# Patient Record
Sex: Female | Born: 1950 | Race: Black or African American | Hispanic: No | Smoking: Current every day smoker
Health system: Southern US, Community
[De-identification: ages and names within clinical notes are randomized; demographics above are authoritative.]

## PROBLEM LIST (undated history)

## (undated) DIAGNOSIS — I10 Essential (primary) hypertension: Secondary | ICD-10-CM

## (undated) HISTORY — PX: TOTAL HIP ARTHROPLASTY: SHX124

---

## 2011-06-30 ENCOUNTER — Other Ambulatory Visit (HOSPITAL_COMMUNITY)
Admission: RE | Admit: 2011-06-30 | Discharge: 2011-06-30 | Disposition: A | Discharge status: Home / Self Care | Payer: BC Managed Care – PPO | Source: Ambulatory Visit | Attending: Obstetrics and Gynecology | Admitting: Obstetrics and Gynecology

## 2011-06-30 DIAGNOSIS — Z124 Encounter for screening for malignant neoplasm of cervix: Secondary | ICD-10-CM | POA: Insufficient documentation

## 2011-07-30 ENCOUNTER — Encounter (HOSPITAL_COMMUNITY): Payer: Self-pay | Admitting: Pharmacist

## 2011-08-03 ENCOUNTER — Inpatient Hospital Stay (HOSPITAL_COMMUNITY): Admission: RE | Admit: 2011-08-03 | Payer: Self-pay | Source: Ambulatory Visit

## 2011-08-07 ENCOUNTER — Encounter (HOSPITAL_COMMUNITY): Admission: RE | Payer: Self-pay | Source: Ambulatory Visit

## 2011-08-07 ENCOUNTER — Inpatient Hospital Stay (HOSPITAL_COMMUNITY)
Admission: RE | Admit: 2011-08-07 | Payer: BC Managed Care – PPO | Source: Ambulatory Visit | Admitting: Obstetrics and Gynecology

## 2011-08-07 SURGERY — HYSTERECTOMY, ABDOMINAL
Anesthesia: General

## 2015-05-17 ENCOUNTER — Other Ambulatory Visit: Payer: Self-pay | Admitting: Internal Medicine

## 2015-05-17 DIAGNOSIS — Z780 Asymptomatic menopausal state: Secondary | ICD-10-CM

## 2015-05-17 DIAGNOSIS — Z1231 Encounter for screening mammogram for malignant neoplasm of breast: Secondary | ICD-10-CM

## 2015-11-30 DIAGNOSIS — M1990 Unspecified osteoarthritis, unspecified site: Secondary | ICD-10-CM | POA: Diagnosis not present

## 2015-11-30 DIAGNOSIS — I10 Essential (primary) hypertension: Secondary | ICD-10-CM | POA: Diagnosis not present

## 2015-11-30 DIAGNOSIS — Z720 Tobacco use: Secondary | ICD-10-CM | POA: Diagnosis not present

## 2015-11-30 DIAGNOSIS — K219 Gastro-esophageal reflux disease without esophagitis: Secondary | ICD-10-CM | POA: Diagnosis not present

## 2016-08-02 DIAGNOSIS — I10 Essential (primary) hypertension: Secondary | ICD-10-CM | POA: Diagnosis not present

## 2016-08-02 DIAGNOSIS — Z131 Encounter for screening for diabetes mellitus: Secondary | ICD-10-CM | POA: Diagnosis not present

## 2016-08-02 DIAGNOSIS — Z01118 Encounter for examination of ears and hearing with other abnormal findings: Secondary | ICD-10-CM | POA: Diagnosis not present

## 2016-08-02 DIAGNOSIS — Z0000 Encounter for general adult medical examination without abnormal findings: Secondary | ICD-10-CM | POA: Diagnosis not present

## 2016-08-02 DIAGNOSIS — Z136 Encounter for screening for cardiovascular disorders: Secondary | ICD-10-CM | POA: Diagnosis not present

## 2016-08-04 DIAGNOSIS — Z7689 Persons encountering health services in other specified circumstances: Secondary | ICD-10-CM | POA: Diagnosis not present

## 2016-08-04 DIAGNOSIS — R9431 Abnormal electrocardiogram [ECG] [EKG]: Secondary | ICD-10-CM | POA: Diagnosis not present

## 2016-08-04 DIAGNOSIS — I10 Essential (primary) hypertension: Secondary | ICD-10-CM | POA: Diagnosis not present

## 2017-03-09 DIAGNOSIS — M25551 Pain in right hip: Secondary | ICD-10-CM | POA: Diagnosis not present

## 2017-03-09 DIAGNOSIS — Z96643 Presence of artificial hip joint, bilateral: Secondary | ICD-10-CM | POA: Diagnosis not present

## 2017-03-09 DIAGNOSIS — Z96641 Presence of right artificial hip joint: Secondary | ICD-10-CM | POA: Diagnosis not present

## 2017-03-09 DIAGNOSIS — Z471 Aftercare following joint replacement surgery: Secondary | ICD-10-CM | POA: Diagnosis not present

## 2017-03-09 DIAGNOSIS — S7001XA Contusion of right hip, initial encounter: Secondary | ICD-10-CM | POA: Diagnosis not present

## 2017-03-29 DIAGNOSIS — S7001XA Contusion of right hip, initial encounter: Secondary | ICD-10-CM | POA: Diagnosis not present

## 2017-04-16 ENCOUNTER — Emergency Department (HOSPITAL_BASED_OUTPATIENT_CLINIC_OR_DEPARTMENT_OTHER): Payer: Medicare Other

## 2017-04-16 ENCOUNTER — Emergency Department (HOSPITAL_BASED_OUTPATIENT_CLINIC_OR_DEPARTMENT_OTHER)
Admission: EM | Admit: 2017-04-16 | Discharge: 2017-04-17 | Disposition: A | Discharge status: Home / Self Care | Payer: Medicare Other | Attending: Emergency Medicine | Admitting: Emergency Medicine

## 2017-04-16 ENCOUNTER — Encounter (HOSPITAL_BASED_OUTPATIENT_CLINIC_OR_DEPARTMENT_OTHER): Payer: Self-pay | Admitting: *Deleted

## 2017-04-16 DIAGNOSIS — I10 Essential (primary) hypertension: Secondary | ICD-10-CM | POA: Insufficient documentation

## 2017-04-16 DIAGNOSIS — Y999 Unspecified external cause status: Secondary | ICD-10-CM | POA: Insufficient documentation

## 2017-04-16 DIAGNOSIS — Z79899 Other long term (current) drug therapy: Secondary | ICD-10-CM | POA: Insufficient documentation

## 2017-04-16 DIAGNOSIS — W01198A Fall on same level from slipping, tripping and stumbling with subsequent striking against other object, initial encounter: Secondary | ICD-10-CM | POA: Insufficient documentation

## 2017-04-16 DIAGNOSIS — Y93E5 Activity, floor mopping and cleaning: Secondary | ICD-10-CM | POA: Diagnosis not present

## 2017-04-16 DIAGNOSIS — Z96643 Presence of artificial hip joint, bilateral: Secondary | ICD-10-CM | POA: Diagnosis not present

## 2017-04-16 DIAGNOSIS — F17200 Nicotine dependence, unspecified, uncomplicated: Secondary | ICD-10-CM | POA: Diagnosis not present

## 2017-04-16 DIAGNOSIS — S79911A Unspecified injury of right hip, initial encounter: Secondary | ICD-10-CM | POA: Insufficient documentation

## 2017-04-16 DIAGNOSIS — Y929 Unspecified place or not applicable: Secondary | ICD-10-CM | POA: Insufficient documentation

## 2017-04-16 HISTORY — DX: Essential (primary) hypertension: I10

## 2017-04-16 NOTE — ED Provider Notes (Signed)
[facility] Provider Note   CSN: [id number] Arrival date & time: 04/16/17  1949     History   Chief Complaint Chief Complaint  Patient presents with  . Hip Pain    HPI Jenna Wheeler is a 66 y.o. female.  HPI   Jenna Wheeler is a 66 y.o. female, with a history of HTN and bilateral hip arthroplasty, presenting to the ED with Right hip injury that occurred shortly prior to arrival. Patient states she slipped and fell today while mopping the floor, landing on her right hip. Pain is throbbing 2/10 at rest, 8/10 ambulation. Patient adds she fell 6 weeks ago and sustained a hematoma in the same area. She has an appointment on September 6 with her orthopedic surgeon, Dr. Christell Constant, to drain the hematoma. Denies head injury, LOC, neuro deficit, neck/back pain, pelvic pain, or any other complaints.     Past Medical History:  Diagnosis Date  . Hypertension     There are no active problems to display for this patient.   Past Surgical History:  Procedure Laterality Date  . TOTAL HIP ARTHROPLASTY      OB History    No data available       Home Medications    Prior to Admission medications   Medication Sig Start Date End Date Taking? Authorizing Provider  AMLODIPINE BESYLATE PO Take by mouth.   Yes [provider]  HYDROCHLOROTHIAZIDE PO Take by mouth.   Yes [provider]  LOSARTAN POTASSIUM PO Take by mouth.   Yes [provider]  HYDROcodone-acetaminophen (NORCO/VICODIN) 5-325 MG tablet Take 1 tablet by mouth every 6 (six) hours as needed for severe pain. 04/17/17   Anselm Pancoast, PA-C    Family History No family history on file.  Social History Social History  Substance Use Topics  . Smoking status: Current Every Day Smoker  . Smokeless tobacco: Never Used  . Alcohol use No     Allergies   Patient has no known allergies.   Review of Systems Review of Systems  Respiratory: Negative for shortness of breath.     Cardiovascular: Negative for chest pain.  Gastrointestinal: Negative for abdominal pain, nausea and vomiting.  Musculoskeletal: Positive for arthralgias and joint swelling. Negative for back pain and neck pain.  Skin: Negative for wound.  Neurological: Negative for dizziness, syncope, weakness, light-headedness, numbness and headaches.  All other systems reviewed and are negative.    Physical Exam Updated Vital Signs BP (!) 163/92 (BP Location: Right Arm)   Pulse 63   Temp 98.3 F (36.8 C) (Oral)   Resp 18   Ht 5' 7.5" (1.715 m)   Wt 79.4 kg (175 lb)   SpO2 97%   BMI 27.00 kg/m   Physical Exam  Constitutional: She appears well-developed and well-nourished. No distress.  HENT:  Head: Normocephalic and atraumatic.  Eyes: Conjunctivae are normal.  Neck: Neck supple.  Cardiovascular: Normal rate, regular rhythm, normal heart sounds and intact distal pulses.   Pulmonary/Chest: Effort normal and breath sounds normal. No respiratory distress.  Abdominal: Soft. There is no tenderness. There is no guarding.  Musculoskeletal: She exhibits tenderness. She exhibits no edema or deformity.  Patient has an older appearing, firm hematoma to the right lateral hip. She has new or area of swelling, minor tenderness, and fluctuance just distal to this. Total area of swelling 10-15 cm in diameter. Full range of motion in the bilateral hips, knees, and ankles. Patient is weightbearing without assistance.  Lymphadenopathy:    She has no cervical adenopathy.  Neurological: She is alert.  No noted sensory deficits in the bilateral lower extremities. Strength is 5/5 with flexion and extension at the bilateral hips, knees, and ankles. Ambulatory without assistance or gait deficit.  Skin: Skin is warm and dry. Capillary refill takes less than 2 seconds. She is not diaphoretic.  Psychiatric: She has a normal mood and affect. Her behavior is normal.  Nursing note and vitals reviewed.    ED Treatments  / Results  Labs (all labs ordered are listed, but only abnormal results are displayed) Labs Reviewed - No data to display  EKG  EKG Interpretation None       Radiology Dg Hip Unilat W Or Wo Pelvis 1 View Right  Result Date: 04/16/2017 CLINICAL DATA:  Larey Seat a month ago, had hematoma RIGHT hip, fell again today on same hip EXAM: DG HIP (WITH OR WITHOUT PELVIS) 1V RIGHT COMPARISON:  None FINDINGS: BILATERAL hip replacements. Bones demineralized. SI joints symmetric and preserved. No acute fracture, dislocation, or bone destruction. No periprosthetic lucency at RIGHT hip prosthesis ; entirety of the femoral component of LEFT hip prosthesis is not imaged. Scattered phleboliths and vascular calcifications. IMPRESSION: Post BILATERAL total hip arthroplasty. No acute bony abnormalities. Electronically Signed   By: Ulyses Southward M.D.   On: 04/16/2017 20:56    Procedures Procedures (including critical care time)  Medications Ordered in ED Medications - No data to display   Initial Impression / Assessment and Plan / ED Course  I have reviewed the triage vital signs and the nursing notes.  Pertinent labs & imaging results that were available during my care of the patient were reviewed by me and considered in my medical decision making (see chart for details).  Clinical Course as of Apr 17 214  Tue Apr 17, 2017  0028 CT tech informed me that performing a CT on this patient would likely create too much artifact from her bilateral hip arthroplasties to be much value.  [SJ]    Clinical Course User Index [SJ] Matina Rodier C, PA-C    Patient presents with right hip injury. She is ambulatory and has full range of motion in the hip. She has close follow-up with orthopedic surgeon. The patient was given instructions for home care as well as return precautions. Patient voices understanding of these instructions, accepts the plan, and is comfortable with discharge.  Findings and plan of care discussed  with Paula Libra, MD. Dr. Read Drivers personally evaluated and examined this patient.  Final Clinical Impressions(s) / ED Diagnoses   Final diagnoses:  Hip injury, right, initial encounter    New Prescriptions Discharge Medication List as of 04/17/2017 12:36 AM    START taking these medications   Details  HYDROcodone-acetaminophen (NORCO/VICODIN) 5-325 MG tablet Take 1 tablet by mouth every 6 (six) hours as needed for severe pain., Starting Tue 04/17/2017, Print         Ahtziry Saathoff C, PA-C 04/17/17 0216    Molpus, Jonny Ruiz, MD 04/17/17 [phone]

## 2017-04-16 NOTE — ED Notes (Signed)
Patient fell 6 weeks ago and sustained a hematoma about a size of a golf ball and went to see her Orthopedist.  Her Orthopedist informed her that the swelling will subside in few weeks or months and if it does not subside, he will test it or drain it per pt info.  She fell again this afternoon around 3 pm in the kitchen and she noticed that the swelling is more bigger than before.  She stated that the first time she fell, it did not even hurt her.  But this incident hurts her hip but she is still able to walk but slightly limping per patient.

## 2017-04-16 NOTE — ED Triage Notes (Addendum)
She fell a month ago while chasing her grandson and sustained a hematoma to her right hip. She has an appointment to have it drained. She fell a second time today when she lost her balance while mopping the floor. Painful. She is ambulatory with a limp.

## 2017-04-17 ENCOUNTER — Emergency Department (HOSPITAL_BASED_OUTPATIENT_CLINIC_OR_DEPARTMENT_OTHER): Payer: Medicare Other

## 2017-04-17 MED ORDER — HYDROCODONE-ACETAMINOPHEN 5-325 MG PO TABS: 1.0000 | ORAL_TABLET | Freq: Four times a day (QID) | ORAL | 0 refills | Status: DC | PRN

## 2017-04-17 NOTE — Discharge Instructions (Signed)
There were no abnormalities noted on the x-ray. Please follow-up with the orthopedic surgeon, as planned. May take ibuprofen, naproxen, or Tylenol for pain. Vicodin for severe pain. Do not drive or perform other dangerous activities while taking the Vicodin.

## 2017-05-10 DIAGNOSIS — S7001XA Contusion of right hip, initial encounter: Secondary | ICD-10-CM | POA: Diagnosis not present

## 2017-05-10 DIAGNOSIS — T792XXA Traumatic secondary and recurrent hemorrhage and seroma, initial encounter: Secondary | ICD-10-CM | POA: Diagnosis not present

## 2017-05-25 DIAGNOSIS — Z01818 Encounter for other preprocedural examination: Secondary | ICD-10-CM | POA: Diagnosis not present

## 2017-05-25 DIAGNOSIS — Z01810 Encounter for preprocedural cardiovascular examination: Secondary | ICD-10-CM | POA: Diagnosis not present

## 2017-05-28 DIAGNOSIS — Z96643 Presence of artificial hip joint, bilateral: Secondary | ICD-10-CM | POA: Diagnosis not present

## 2017-05-28 DIAGNOSIS — E049 Nontoxic goiter, unspecified: Secondary | ICD-10-CM | POA: Diagnosis not present

## 2017-05-28 DIAGNOSIS — S7011XA Contusion of right thigh, initial encounter: Secondary | ICD-10-CM | POA: Diagnosis not present

## 2017-05-28 DIAGNOSIS — T792XXA Traumatic secondary and recurrent hemorrhage and seroma, initial encounter: Secondary | ICD-10-CM | POA: Diagnosis not present

## 2017-05-28 DIAGNOSIS — M1990 Unspecified osteoarthritis, unspecified site: Secondary | ICD-10-CM | POA: Diagnosis not present

## 2017-05-28 DIAGNOSIS — I10 Essential (primary) hypertension: Secondary | ICD-10-CM | POA: Diagnosis not present

## 2017-05-28 DIAGNOSIS — K219 Gastro-esophageal reflux disease without esophagitis: Secondary | ICD-10-CM | POA: Diagnosis not present

## 2018-06-16 IMAGING — DX DG HIP (WITH OR WITHOUT PELVIS) 1V*R*
3 series · 3 of 3 positions shown · non-contrast
Comparison: None

CLINICAL DATA: Fell a month ago, had hematoma RIGHT hip, fell again
today on same hip

EXAM:
DG HIP (WITH OR WITHOUT PELVIS) 1V RIGHT

[pelvis ap]
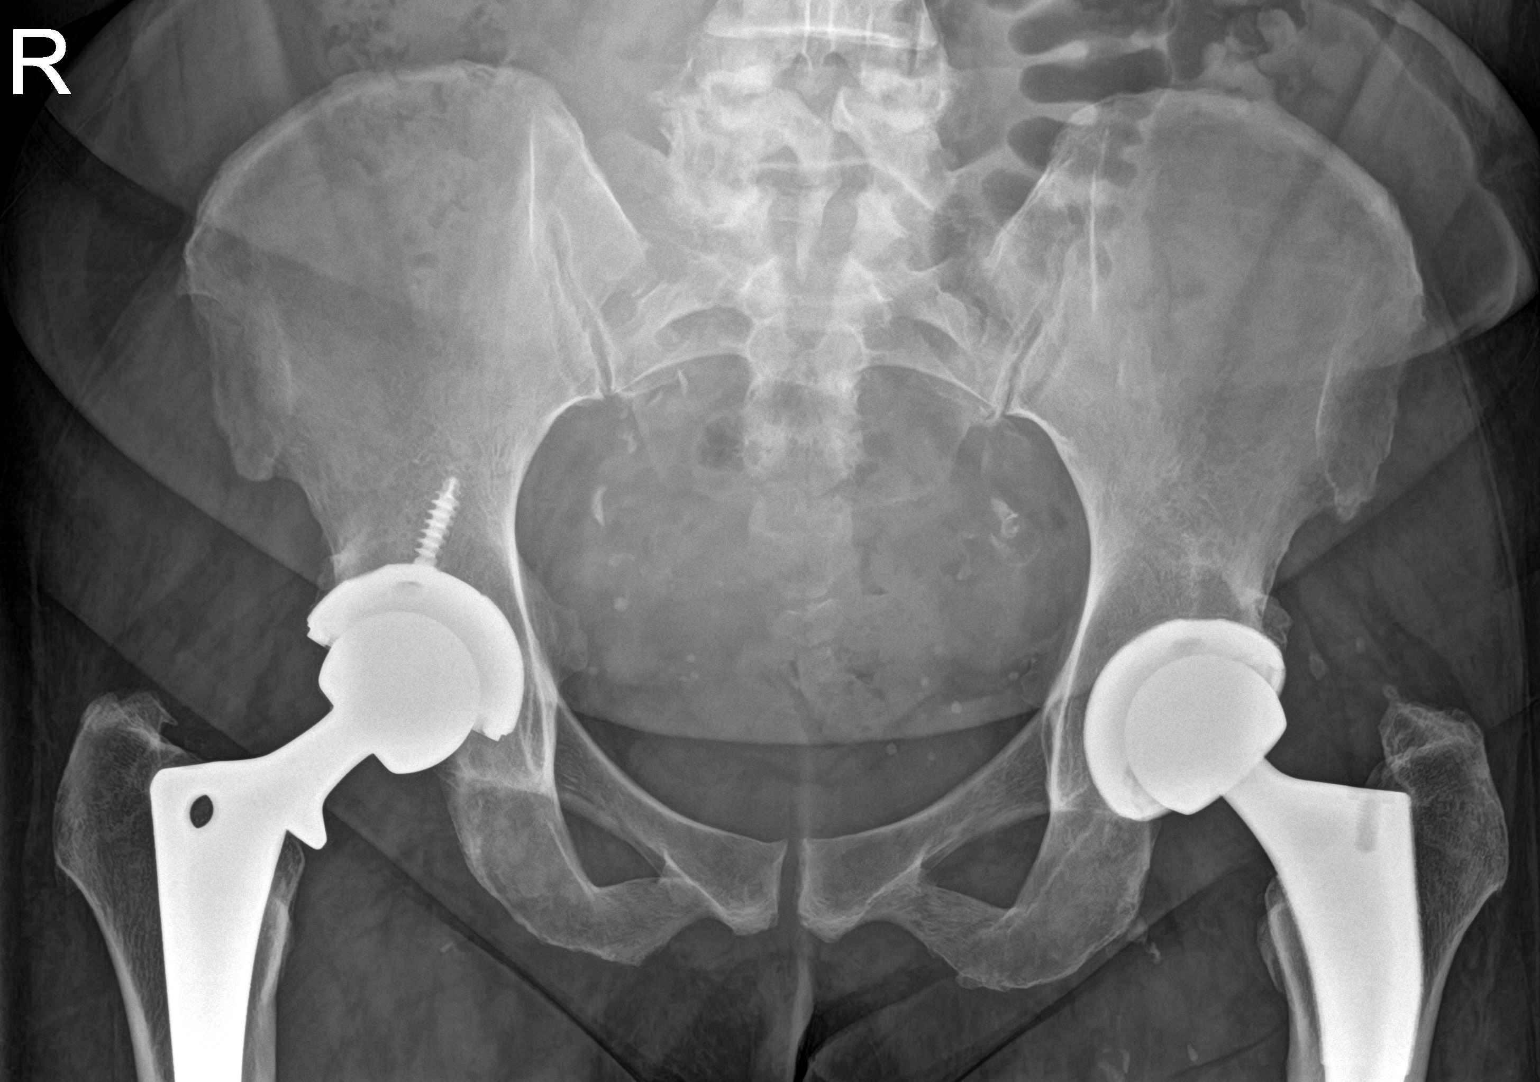

[hip ap]
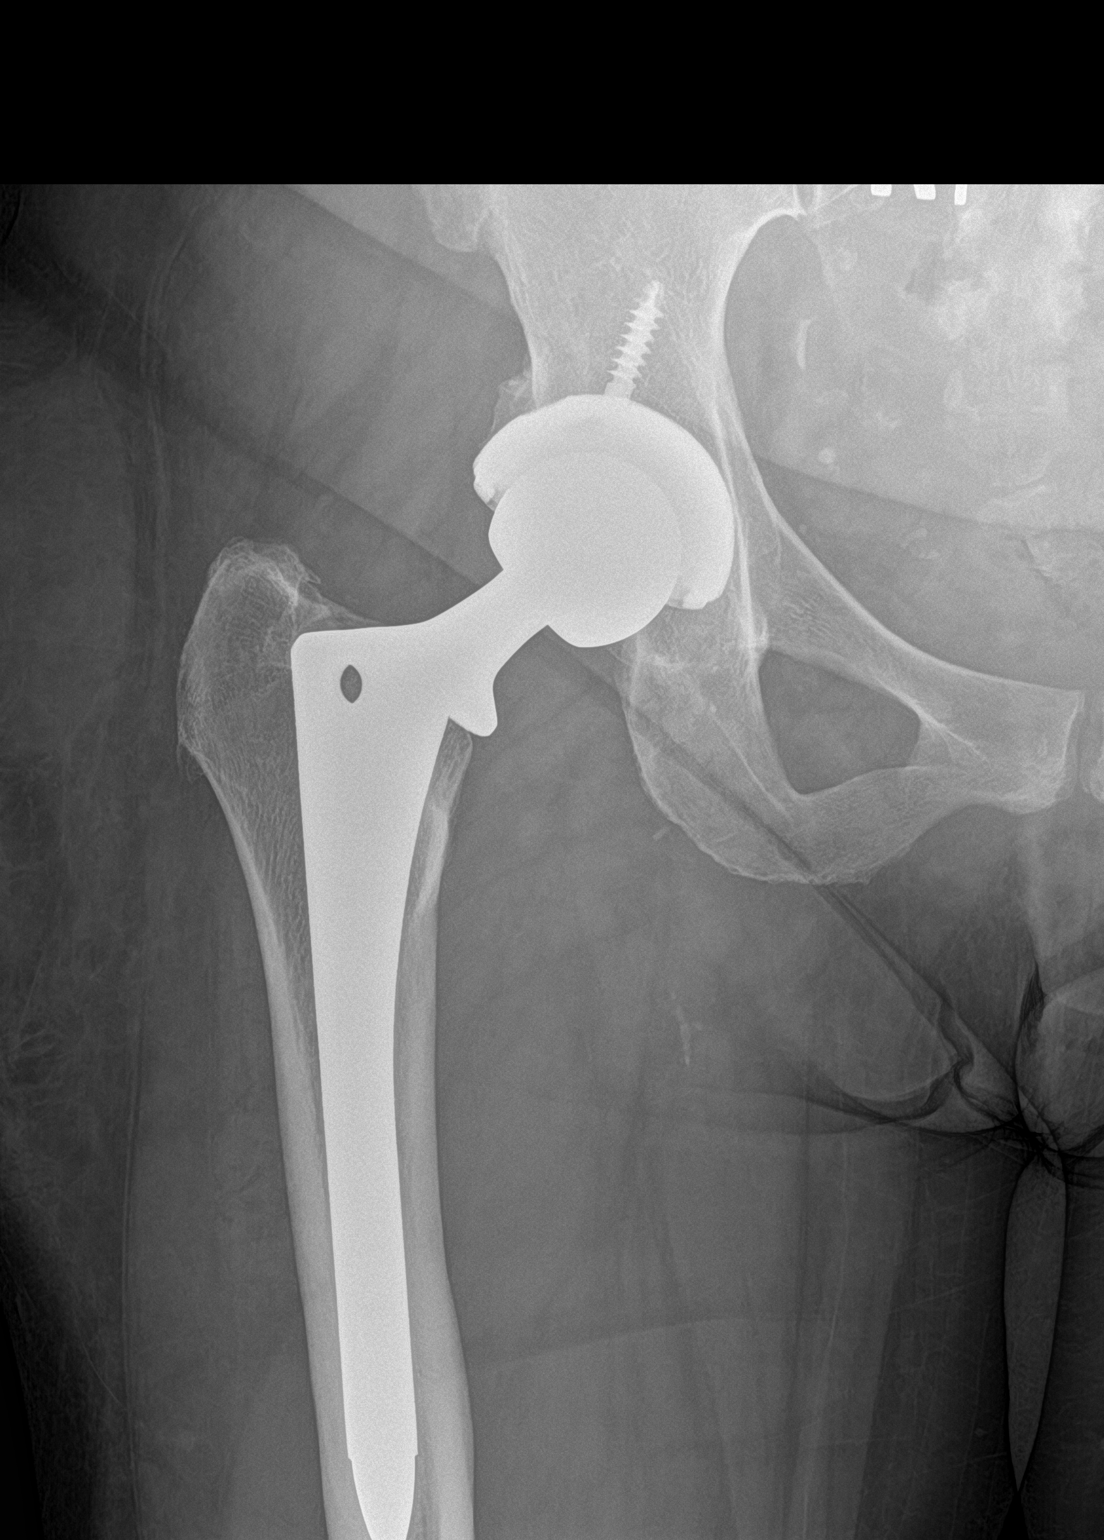

[hip lat]
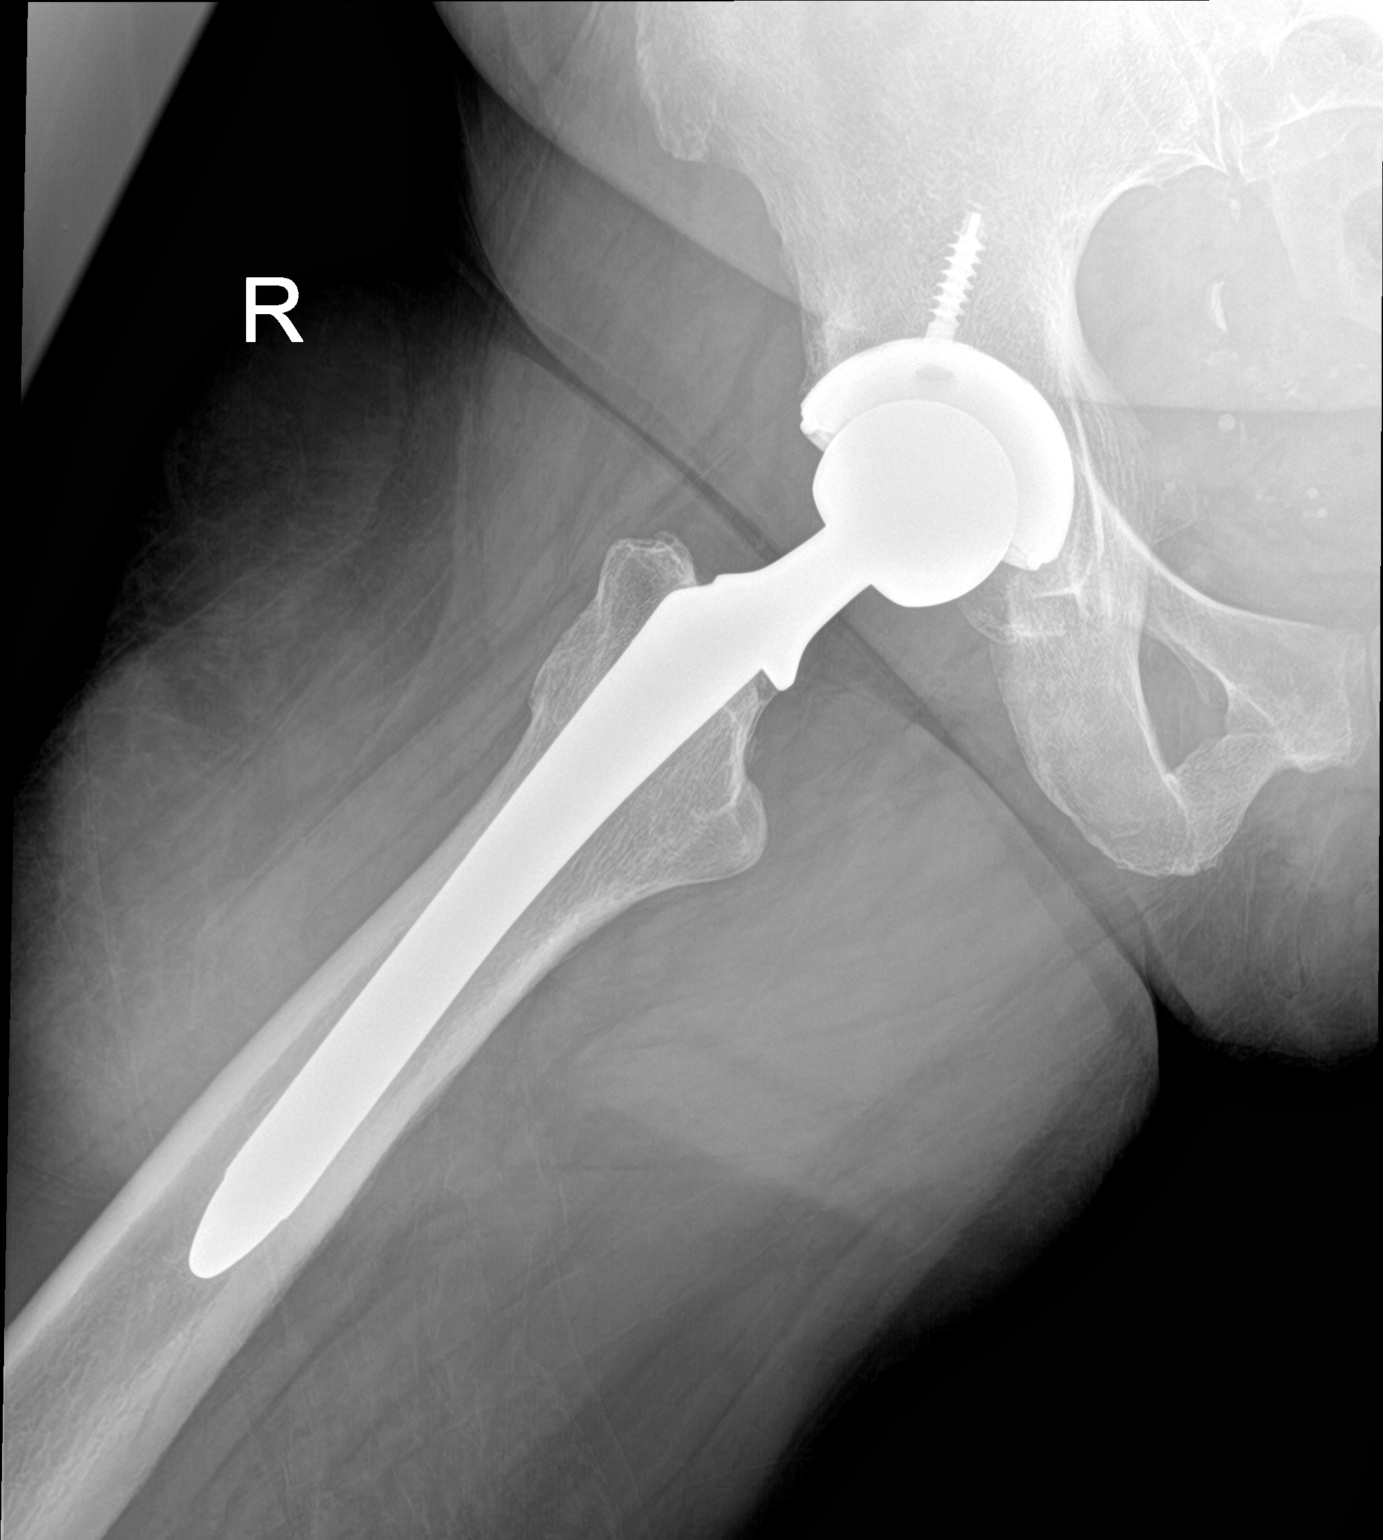

[3 of 3 positions shown; findings below may reference images not displayed]

FINDINGS: BILATERAL hip replacements.

Bones demineralized.

SI joints symmetric and preserved.

No acute fracture, dislocation, or bone destruction.

No periprosthetic lucency at RIGHT hip prosthesis ; entirety of the
femoral component of LEFT hip prosthesis is not imaged.

Scattered phleboliths and vascular calcifications.
IMPRESSION: Post BILATERAL total hip arthroplasty.

No acute bony abnormalities.

## 2020-09-04 ENCOUNTER — Other Ambulatory Visit: Payer: Self-pay

## 2020-09-04 ENCOUNTER — Emergency Department (HOSPITAL_BASED_OUTPATIENT_CLINIC_OR_DEPARTMENT_OTHER)
Admission: EM | Admit: 2020-09-04 | Discharge: 2020-09-04 | Disposition: A | Discharge status: Home / Self Care | Payer: Medicare Other | Attending: Emergency Medicine | Admitting: Emergency Medicine

## 2020-09-04 ENCOUNTER — Encounter (HOSPITAL_BASED_OUTPATIENT_CLINIC_OR_DEPARTMENT_OTHER): Payer: Self-pay | Admitting: Emergency Medicine

## 2020-09-04 DIAGNOSIS — Z79899 Other long term (current) drug therapy: Secondary | ICD-10-CM | POA: Insufficient documentation

## 2020-09-04 DIAGNOSIS — I10 Essential (primary) hypertension: Secondary | ICD-10-CM | POA: Diagnosis not present

## 2020-09-04 DIAGNOSIS — T148XXA Other injury of unspecified body region, initial encounter: Secondary | ICD-10-CM

## 2020-09-04 DIAGNOSIS — F17200 Nicotine dependence, unspecified, uncomplicated: Secondary | ICD-10-CM | POA: Insufficient documentation

## 2020-09-04 DIAGNOSIS — S39012A Strain of muscle, fascia and tendon of lower back, initial encounter: Secondary | ICD-10-CM | POA: Diagnosis not present

## 2020-09-04 DIAGNOSIS — M79604 Pain in right leg: Secondary | ICD-10-CM | POA: Diagnosis not present

## 2020-09-04 DIAGNOSIS — R519 Headache, unspecified: Secondary | ICD-10-CM | POA: Diagnosis not present

## 2020-09-04 DIAGNOSIS — S3992XA Unspecified injury of lower back, initial encounter: Secondary | ICD-10-CM | POA: Diagnosis present

## 2020-09-04 DIAGNOSIS — R059 Cough, unspecified: Secondary | ICD-10-CM | POA: Insufficient documentation

## 2020-09-04 DIAGNOSIS — Z96649 Presence of unspecified artificial hip joint: Secondary | ICD-10-CM | POA: Diagnosis not present

## 2020-09-04 DIAGNOSIS — M79605 Pain in left leg: Secondary | ICD-10-CM | POA: Diagnosis not present

## 2020-09-04 MED ORDER — NAPROXEN 250 MG PO TABS
500.0000 mg | ORAL_TABLET | Freq: Once | ORAL | Status: AC
  Administered 2020-09-04: 500 mg via ORAL
  Filled 2020-09-04: qty 2

## 2020-09-04 MED ORDER — NAPROXEN 375 MG PO TABS: ORAL_TABLET | ORAL | 0 refills | Status: AC

## 2020-09-04 MED ORDER — CYCLOBENZAPRINE HCL 10 MG PO TABS: 10.0000 mg | ORAL_TABLET | Freq: Three times a day (TID) | ORAL | 0 refills | Status: AC | PRN

## 2020-09-04 MED ORDER — CYCLOBENZAPRINE HCL 10 MG PO TABS
10.0000 mg | ORAL_TABLET | Freq: Once | ORAL | Status: AC
  Administered 2020-09-04: 10 mg via ORAL
  Filled 2020-09-04: qty 1

## 2020-09-04 NOTE — ED Notes (Signed)
Pt used her call bell to request pain medicine. Pt made aware that pt has to be evaluated prior to medication being ordered/administered.
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
## 2020-09-04 NOTE — ED Notes (Signed)
MD made aware that pt is now requesting medication for a Headache.
